# Patient Record
Sex: Male | Born: 1960 | Race: White | Hispanic: No | Marital: Married | Smoking: Current every day smoker
Health system: Southern US, Community
[De-identification: ages and names within clinical notes are randomized; demographics above are authoritative.]

## PROBLEM LIST (undated history)

## (undated) DIAGNOSIS — I2510 Atherosclerotic heart disease of native coronary artery without angina pectoris: Secondary | ICD-10-CM

---

## 2016-05-24 ENCOUNTER — Emergency Department
Admission: EM | Admit: 2016-05-24 | Discharge: 2016-05-24 | Disposition: A | Discharge status: Home / Self Care | Payer: No Typology Code available for payment source | Attending: Emergency Medicine | Admitting: Emergency Medicine

## 2016-05-24 ENCOUNTER — Encounter: Payer: Self-pay | Admitting: Emergency Medicine

## 2016-05-24 DIAGNOSIS — R631 Polydipsia: Secondary | ICD-10-CM

## 2016-05-24 DIAGNOSIS — E871 Hypo-osmolality and hyponatremia: Secondary | ICD-10-CM | POA: Diagnosis not present

## 2016-05-24 DIAGNOSIS — E1165 Type 2 diabetes mellitus with hyperglycemia: Secondary | ICD-10-CM | POA: Diagnosis not present

## 2016-05-24 DIAGNOSIS — R358 Other polyuria: Secondary | ICD-10-CM

## 2016-05-24 DIAGNOSIS — I2510 Atherosclerotic heart disease of native coronary artery without angina pectoris: Secondary | ICD-10-CM | POA: Diagnosis not present

## 2016-05-24 DIAGNOSIS — E119 Type 2 diabetes mellitus without complications: Secondary | ICD-10-CM

## 2016-05-24 DIAGNOSIS — R3589 Other polyuria: Secondary | ICD-10-CM

## 2016-05-24 DIAGNOSIS — Z79899 Other long term (current) drug therapy: Secondary | ICD-10-CM | POA: Diagnosis not present

## 2016-05-24 HISTORY — DX: Atherosclerotic heart disease of native coronary artery without angina pectoris: I25.10

## 2016-05-24 LAB — BASIC METABOLIC PANEL
ANION GAP: 5 (ref 5–15)
Anion gap: 7 (ref 5–15)
BUN: 15 mg/dL (ref 6–20)
BUN: 14 mg/dL (ref 6–20)
CALCIUM: 8.7 mg/dL — AB (ref 8.9–10.3)
CALCIUM: 8.3 mg/dL — AB (ref 8.9–10.3)
CO2: 27 mmol/L (ref 22–32)
CO2: 28 mmol/L (ref 22–32)
CREATININE: 0.94 mg/dL (ref 0.61–1.24)
Chloride: 95 mmol/L — ABNORMAL LOW (ref 101–111)
Chloride: 99 mmol/L — ABNORMAL LOW (ref 101–111)
Creatinine, Ser: 0.85 mg/dL (ref 0.61–1.24)
GFR calc Af Amer: >60 mL/min (ref >60)
GFR calc non Af Amer: >60 mL/min (ref >60)
GFR calc non Af Amer: >60 mL/min (ref >60)
GLUCOSE: 507 mg/dL — AB (ref 65–99)
Glucose, Bld: 325 mg/dL — ABNORMAL HIGH (ref 65–99)
Potassium: 4.4 mmol/L (ref 3.5–5.1)
Potassium: 3.9 mmol/L (ref 3.5–5.1)
Sodium: 129 mmol/L — ABNORMAL LOW (ref 135–145)
Sodium: 132 mmol/L — ABNORMAL LOW (ref 135–145)

## 2016-05-24 LAB — GLUCOSE, CAPILLARY: Glucose-Capillary: 508 mg/dL (ref 65–99)

## 2016-05-24 LAB — URINALYSIS, COMPLETE (UACMP) WITH MICROSCOPIC
Bacteria, UA: NONE SEEN
Bilirubin Urine: NEGATIVE
Hgb urine dipstick: NEGATIVE
Ketones, ur: NEGATIVE mg/dL
Leukocytes, UA: NEGATIVE
Nitrite: NEGATIVE
PH: 6 (ref 5.0–8.0)
PROTEIN: NEGATIVE mg/dL
RBC / HPF: NONE SEEN RBC/hpf (ref 0–5)
Specific Gravity, Urine: 1.033 — ABNORMAL HIGH (ref 1.005–1.030)
Squamous Epithelial / LPF: NONE SEEN
WBC UA: NONE SEEN WBC/hpf (ref 0–5)

## 2016-05-24 LAB — CBC
HCT: 45.6 % (ref 40.0–52.0)
Hemoglobin: 15.8 g/dL (ref 13.0–18.0)
MCH: 32.2 pg (ref 26.0–34.0)
MCHC: 34.8 g/dL (ref 32.0–36.0)
MCV: 92.5 fL (ref 80.0–100.0)
PLATELETS: 157 10*3/uL (ref 150–440)
RBC: 4.92 MIL/uL (ref 4.40–5.90)
RDW: 13.3 % (ref 11.5–14.5)
WBC: 6.3 10*3/uL (ref 3.8–10.6)

## 2016-05-24 MED ORDER — SODIUM CHLORIDE 0.9 % IV BOLUS (SEPSIS)
1000.0000 mL | Freq: Once | INTRAVENOUS | Status: AC
  Administered 2016-05-24: 1000 mL via INTRAVENOUS

## 2016-05-24 MED ORDER — INSULIN ASPART 100 UNIT/ML ~~LOC~~ SOLN
10.0000 [IU] | Freq: Once | SUBCUTANEOUS | Status: AC
  Administered 2016-05-24: 10 [IU] via INTRAVENOUS
  Filled 2016-05-24: qty 10

## 2016-05-24 MED ORDER — METFORMIN HCL 500 MG PO TABS: 500.0000 mg | ORAL_TABLET | Freq: Two times a day (BID) | ORAL | 0 refills | Status: AC

## 2016-05-24 NOTE — ED Provider Notes (Signed)
[facility] Emergency Department Provider Note  ____________________________________________  Time seen: Approximately 4:45 PM  I have reviewed the triage vital signs and the nursing notes.   HISTORY  Chief Complaint Hyperglycemia    HPI Jim Black is a 56 y.o. male with a history of CAD, obesity, presenting for hyperglycemia. The patient [facility] when routine labs revealed a blood sugar of greater than 500. The cardiologist called and recommended ED evaluation. The patient reports that since December 15, he has had polyuria and polydipsia. He denies any recent illness including fever, chills, urinary symptoms, abdominal pain, nausea vomiting or diarrhea.   Past Medical History:  Diagnosis Date  . Coronary artery disease     There are no active problems to display for this patient.   History reviewed. No pertinent surgical history.    Allergies Patient has no known allergies.  No family history on file.  Social History Social History  Substance Use Topics  . Smoking status: Current Every Day Smoker  . Smokeless tobacco: Never Used  . Alcohol use No    Review of Systems Constitutional: No fever/chills.No lightheadedness or syncope. Eyes: No visual changes. ENT: No sore throat. No congestion or rhinorrhea. Cardiovascular: Denies chest pain. Denies palpitations. Respiratory: Denies shortness of breath.  No cough. Gastrointestinal: No abdominal pain.  No nausea, no vomiting.  No diarrhea.  No constipation. Positive polyuria. Positive polydipsia. Genitourinary: Negative for dysuria. Musculoskeletal: Negative for back pain. Skin: Negative for rash. Neurological: Negative for headaches. No focal numbness, tingling or weakness.   10-point ROS otherwise negative.  ____________________________________________   PHYSICAL EXAM:  VITAL SIGNS: ED Triage Vitals [05/24/16 1517]  Enc  Vitals Group     BP 134/82     Pulse Rate 79     Resp 16     Temp 98 F (36.7 C)     Temp Source Oral     SpO2 96 %     Weight 287 lb (130.2 kg)     Height 6\' 1"  (1.854 m)     Head Circumference      Peak Flow      Pain Score      Pain Loc      Pain Edu?      Excl. in GC?     Constitutional: Alert and oriented. Well appearing and in no acute distress. Answers questions appropriately. Eyes: Conjunctivae are normal.  EOMI. No scleral icterus. Head: Atraumatic. Nose: No congestion/rhinnorhea. Mouth/Throat: Mucous membranes are moist.  Neck: No stridor.  Supple.   Cardiovascular: Normal rate, regular rhythm. No murmurs, rubs or gallops.  Respiratory: Normal respiratory effort.  No accessory muscle use or retractions. Lungs CTAB.  No wheezes, rales or ronchi. Gastrointestinal: Obese. Soft, nontender and nondistended.  No guarding or rebound.  No peritoneal signs. Musculoskeletal: No LE edema.  Neurologic:  A&Ox3.  Speech is clear.  Face and smile are symmetric.  EOMI.  Moves all extremities well. Skin:  Skin is warm, dry and intact. No rash noted. Psychiatric: Mood and affect are normal. Speech and behavior are normal.  Normal judgement.  ____________________________________________   LABS (all labs ordered are listed, but only abnormal results are displayed)  Labs Reviewed  BASIC METABOLIC PANEL - Abnormal; Notable for the following:       Result Value   Sodium 129 (*)    Chloride 95 (*)    Glucose, Bld 507 (*)  Calcium 8.7 (*)    All other components within normal limits  URINALYSIS, COMPLETE (UACMP) WITH MICROSCOPIC - Abnormal; Notable for the following:    Color, Urine STRAW (*)    APPearance CLEAR (*)    Specific Gravity, Urine 1.033 (*)    Glucose, UA >=500 (*)    All other components within normal limits  GLUCOSE, CAPILLARY - Abnormal; Notable for the following:    Glucose-Capillary 508 (*)    All other components within normal limits  CBC  BASIC METABOLIC  PANEL  CBG MONITORING, ED   ____________________________________________  EKG  ED ECG REPORT I, Rockne Menghini, the attending physician, personally viewed and interpreted this ECG.   Date: 05/24/2016  EKG Time: 1833  Rate: 71  Rhythm: normal sinus rhythm  Axis: leftward  Intervals:none  ST&T Change: Nonspecific T-wave inversions in V1- V4; no STEMI.  ____________________________________________  RADIOLOGY  No results found.  ____________________________________________   PROCEDURES  Procedure(s) performed: None  Procedures  Critical Care performed: No ____________________________________________   INITIAL IMPRESSION / ASSESSMENT AND PLAN / ED COURSE  Pertinent labs & imaging results that were available during my care of the patient were reviewed by me and considered in my medical decision making (see chart for details).  56 y.o. male presenting with hyperglycemia. This patient has new onset diabetes, but no evidence of DKA today. He has mild hyponatremia which is likely due to his hyperglycemia, and we will recheck with a blood sugar and a sodium after fluid and insulin. I've spoken with Dr. Judithann Sheen, who is covering for Dr. Marcello Fennel today, who recommends starting the patient on metformin 500 mg twice a day, and having him follow-up in the primary care physician's office in the next 1-2 days. I've discussed this with the patient and he understands return precautions as well as follow-up instructions.  ____________________________________________  FINAL CLINICAL IMPRESSION(S) / ED DIAGNOSES  Final diagnoses:  New onset type 2 diabetes mellitus (HCC)  Hyponatremia  Polyuria  Polydipsia    Clinical Course       NEW MEDICATIONS STARTED DURING THIS VISIT:  New Prescriptions   METFORMIN (GLUCOPHAGE) 500 MG TABLET    Take 1 tablet (500 mg total) by mouth 2 (two) times daily with a meal.      Rockne Menghini, MD 05/24/16 1849

## 2016-05-24 NOTE — ED Triage Notes (Signed)
Pt was instructed by PCP to come here for elevated CBG. Pt states was 575.

## 2016-05-24 NOTE — Discharge Instructions (Signed)
Please start taking metformin 500 mg twice daily tomorrow morning. Please call to schedule an appointment with a primary care physician in the next 1-2 days.  Return to the emergency department if you develop severe pain, vomiting, fever, lightheadedness or fainting, or any other symptoms concerning to you.

## 2016-05-24 NOTE — ED Notes (Signed)
Pt states blood draw at PCP this morning was not fasting. Pt reports having breakfast prior to blood draw. Pt reports having lunch prior to coming to ED.

## 2017-01-04 ENCOUNTER — Encounter: Payer: Self-pay | Admitting: Emergency Medicine

## 2017-01-04 ENCOUNTER — Emergency Department: Payer: No Typology Code available for payment source

## 2017-01-04 ENCOUNTER — Emergency Department
Admission: EM | Admit: 2017-01-04 | Discharge: 2017-01-04 | Disposition: A | Discharge status: Home / Self Care | Payer: No Typology Code available for payment source | Attending: Emergency Medicine | Admitting: Emergency Medicine

## 2017-01-04 DIAGNOSIS — I2510 Atherosclerotic heart disease of native coronary artery without angina pectoris: Secondary | ICD-10-CM | POA: Insufficient documentation

## 2017-01-04 DIAGNOSIS — I509 Heart failure, unspecified: Secondary | ICD-10-CM | POA: Diagnosis not present

## 2017-01-04 DIAGNOSIS — Z79899 Other long term (current) drug therapy: Secondary | ICD-10-CM | POA: Diagnosis not present

## 2017-01-04 DIAGNOSIS — R0602 Shortness of breath: Secondary | ICD-10-CM | POA: Diagnosis present

## 2017-01-04 DIAGNOSIS — F17210 Nicotine dependence, cigarettes, uncomplicated: Secondary | ICD-10-CM | POA: Insufficient documentation

## 2017-01-04 LAB — CBC
HEMATOCRIT: 44.1 % (ref 40.0–52.0)
Hemoglobin: 15.4 g/dL (ref 13.0–18.0)
MCH: 31.2 pg (ref 26.0–34.0)
MCHC: 34.9 g/dL (ref 32.0–36.0)
MCV: 89.6 fL (ref 80.0–100.0)
PLATELETS: 154 10*3/uL (ref 150–440)
RBC: 4.92 MIL/uL (ref 4.40–5.90)
RDW: 13.6 % (ref 11.5–14.5)
WBC: 7 10*3/uL (ref 3.8–10.6)

## 2017-01-04 LAB — BASIC METABOLIC PANEL
Anion gap: 8 (ref 5–15)
BUN: 10 mg/dL (ref 6–20)
CHLORIDE: 101 mmol/L (ref 101–111)
CO2: 26 mmol/L (ref 22–32)
CREATININE: 0.83 mg/dL (ref 0.61–1.24)
Calcium: 8.6 mg/dL — ABNORMAL LOW (ref 8.9–10.3)
GFR calc non Af Amer: >60 mL/min (ref >60)
Glucose, Bld: 280 mg/dL — ABNORMAL HIGH (ref 65–99)
POTASSIUM: 4 mmol/L (ref 3.5–5.1)
Sodium: 135 mmol/L (ref 135–145)

## 2017-01-04 LAB — BRAIN NATRIURETIC PEPTIDE: B Natriuretic Peptide: 35 pg/mL (ref 0.0–100.0)

## 2017-01-04 LAB — TROPONIN I: Troponin I: <0.03 ng/mL (ref <0.03)

## 2017-01-04 NOTE — ED Provider Notes (Signed)
[facility] Emergency Department Provider Note  ____________________________________________   I have reviewed the triage vital signs and the nursing notes.   HISTORY  Chief Complaint I'm ready to go   HPI Jim Black is a 56 y.o. male with a history of chf. He says he is on torsemide daily. He forgot to take it yesterday. Today, he felt that he was having some fluid build up, as he was mildly sob.  He took his torsemide and went to the bathroom 11 times since he got here and now he is back to normal and wants to go home. No chest pain no leg swelling no hx of dvt or pe no n/v no leg swelling  Declines further w/u.   Past Medical History:  Diagnosis Date  . Coronary artery disease     There are no active problems to display for this patient.   History reviewed. No pertinent surgical history.  Prior to Admission medications   Medication Sig Start Date End Date Taking? Authorizing Provider  carvedilol (COREG) 25 MG tablet Take 25 mg by mouth 2 (two) times daily with a meal.    [provider]  citalopram (CELEXA) 40 MG tablet Take 40 mg by mouth daily.    [provider]  metFORMIN (GLUCOPHAGE) 500 MG tablet Take 1 tablet (500 mg total) by mouth 2 (two) times daily with a meal. 05/24/16 05/24/17  Rockne Menghini, MD    Allergies Patient has no known allergies.  History reviewed. No pertinent family history.  Social History Social History  Substance Use Topics  . Smoking status: Current Every Day Smoker  . Smokeless tobacco: Never Used  . Alcohol use No    Review of Systems Constitutional: No fever/chills Eyes: No visual changes. ENT: No sore throat. No stiff neck no neck pain Cardiovascular: Denies chest pain. Respiratory: Denies onging shortness of breath. Gastrointestinal:   no vomiting.  No diarrhea.  No constipation. Genitourinary: Negative for dysuria. Musculoskeletal: Negative lower extremity swelling Skin:  Negative for rash. Neurological: Negative for severe headaches, focal weakness or numbness.   ____________________________________________   PHYSICAL EXAM:  VITAL SIGNS: ED Triage Vitals  Enc Vitals Group     BP 01/04/17 1316 (!) 141/85     Pulse Rate 01/04/17 1316 100     Resp 01/04/17 1316 18     Temp 01/04/17 1316 97.8 F (36.6 C)     Temp Source 01/04/17 1316 Oral     SpO2 01/04/17 1316 100 %     Weight 01/04/17 1317 (!) 301 lb (136.5 kg)     Height 01/04/17 1317 6\' 1"  (1.854 m)     Head Circumference --      Peak Flow --      Pain Score 01/04/17 1315 0     Pain Loc --      Pain Edu? --      Excl. in GC? --     Constitutional: Alert and oriented. Well appearing and in no acute distress. Eyes: Conjunctivae are normal Head: Atraumatic HEENT: No congestion/rhinnorhea. Mucous membranes are moist.  Oropharynx non-erythematous Neck:   Nontender with no meningismus, no masses, no stridor Cardiovascular: Normal rate, regular rhythm. Grossly normal heart sounds.  Good peripheral circulation. Respiratory: Normal respiratory effort.  No retractions. Lungs CTAB. Abdominal: Soft and nontender. No distention. No guarding no rebound Back:  There is no focal tenderness or step off.  there is no midline tenderness there are no lesions noted. there is no  CVA tenderness Musculoskeletal: No lower extremity tenderness, no upper extremity tenderness. No joint effusions, no DVT signs strong distal pulses no edema Neurologic:  Normal speech and language. No gross focal neurologic deficits are appreciated.  Skin:  Skin is warm, dry and intact. No rash noted. Psychiatric: Mood and affect are normal. Speech and behavior are normal.  ____________________________________________   LABS (all labs ordered are listed, but only abnormal results are displayed)  Labs Reviewed  BASIC METABOLIC PANEL - Abnormal; Notable for the following:       Result Value   Glucose, Bld 280 (*)    Calcium 8.6  (*)    All other components within normal limits  CBC  TROPONIN I  BRAIN NATRIURETIC PEPTIDE   ____________________________________________  EKG  I personally interpreted any EKGs ordered by me or triage Sinus tach rate 101 lad noted no ste no std  ____________________________________________  RADIOLOGY  I reviewed any imaging ordered by me or triage that were performed during my shift and, if possible, patient and/or family made aware of any abnormal findings. ____________________________________________   PROCEDURES  Procedure(s) performed: None  Procedures  Critical Care performed: None  ____________________________________________   INITIAL IMPRESSION / ASSESSMENT AND PLAN / ED COURSE  Pertinent labs & imaging results that were available during my care of the patient were reviewed by me and considered in my medical decision making (see chart for details).  Pt in nad with no complaints or sx. Was noncompliant with his torsemide, had some subjective fluid build up and took his torsemide and his symptoms resolved with diuresis. Patient has absolutely no symptoms at this time. I did offer him further observational stay, I offered him torsemide here I offered him serial cardiac enzymes. He refuses all of this. He states he just wants to go home. This time I dee any evidence of impending disaster and there certainly was never any chest pain. However, patient is aware that my official recommendation is that he stay longer for further evaluation of the symptoms. Nothing to suggest ACS PE or dissection given w/u that patient permits. Return precautions and f/u stressed.     ____________________________________________   FINAL CLINICAL IMPRESSION(S) / ED DIAGNOSES  Final diagnoses:  None      This chart was dictated using voice recognition software.  Despite best efforts to proofread,  errors can occur which can change meaning.      Jeanmarie Plant, MD 01/04/17  [phone]

## 2017-01-04 NOTE — ED Triage Notes (Signed)
Pt to ed with c/o sob acute onset about 1 hour ago.  Pt reports he took 1 torsemide at home which he uses daily for CHF.  Pt reports this feels like CHF.  No noted swelling to pt feet and ankles, although pt does report distention in abd.

## 2017-01-04 NOTE — Discharge Instructions (Signed)
Return to the emergency department for any new or worrisome symptoms including chest pain or shortness of breath.  As we discussed, without further stay in the emergency department there is a limit to what we can do, and you have declined to stay further. This is certainly your  choice but does limit our ability to evaluate you. It is therefore essential that you  remain vigilant about your  health and return to the emergency department for any new or worrisome symptoms. Take your torsemide every day, and follow closely with primary care and cardiology as an outpatient.

## 2018-07-12 IMAGING — CR DG CHEST 2V
2 series · 2 of 2 positions shown · non-contrast
Comparison: None.

CLINICAL DATA: Chest pain and shortness of breath.

EXAM:
CHEST  2 VIEW

[chest pa]
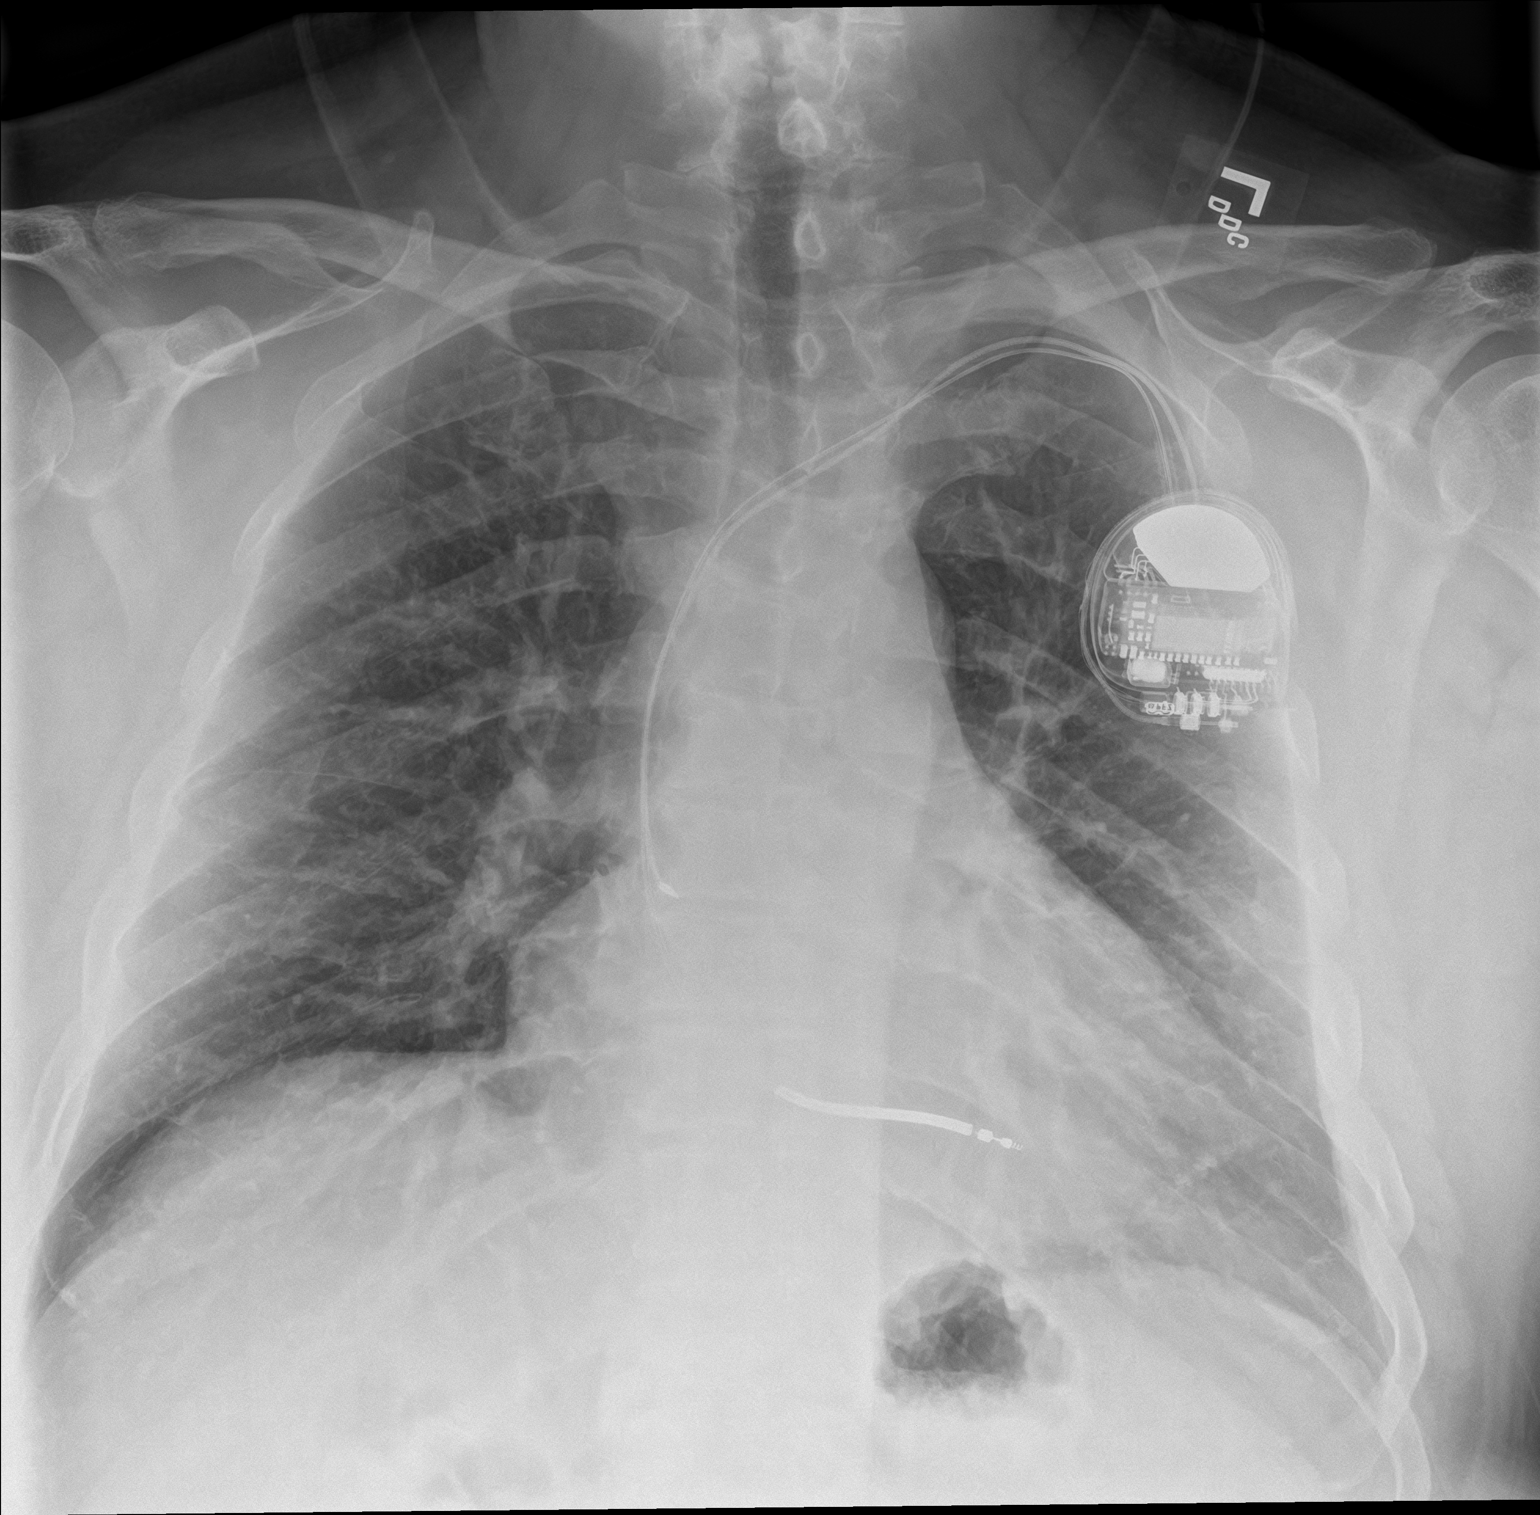

[chest lat]
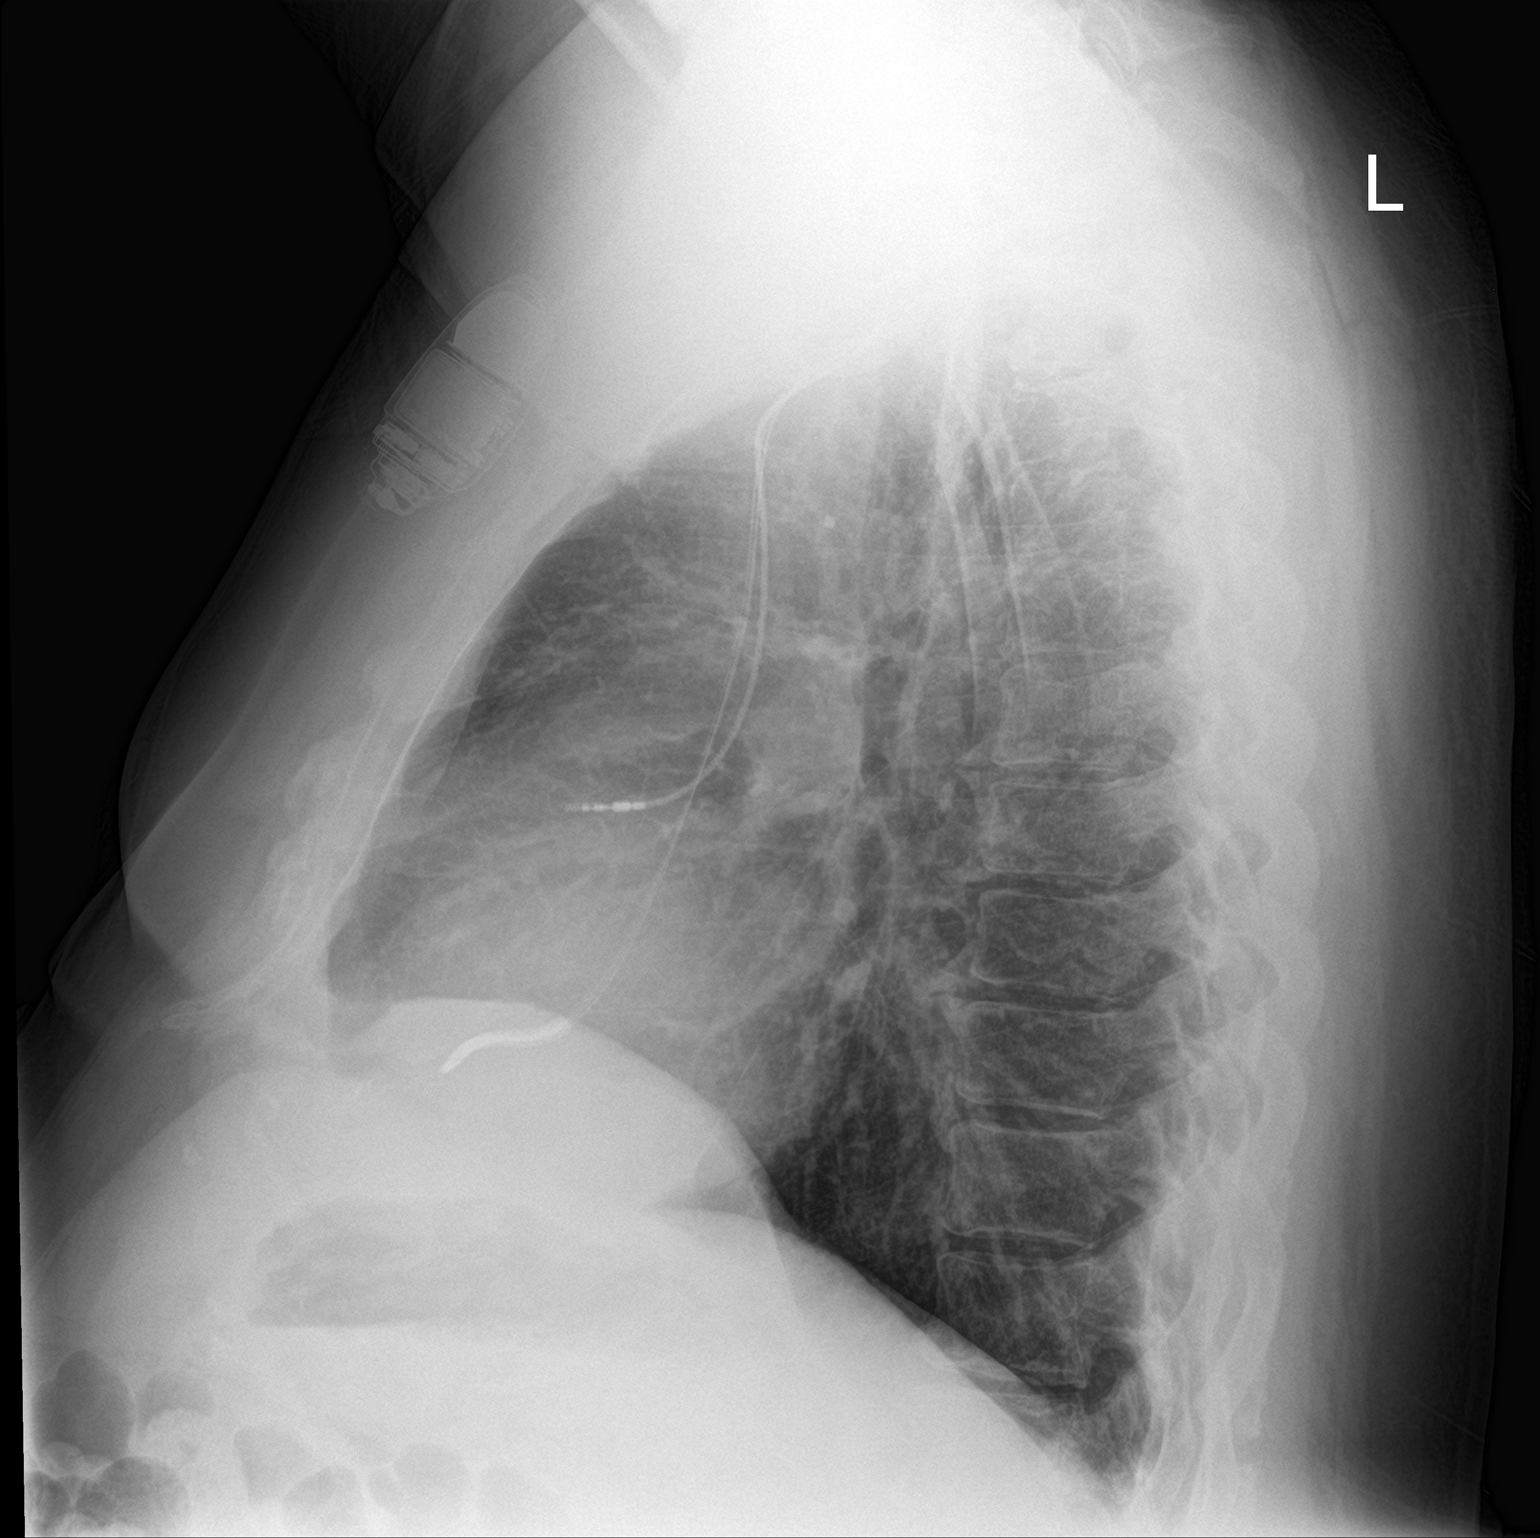

[2 of 2 positions shown; findings below may reference images not displayed]

FINDINGS: The cardiomediastinal silhouette is enlarged. Left chest wall AICD
with leads terminating in the right ventricle and right atrium. Mild
pulmonary vascular congestion. No focal consolidation, pleural
effusion, or pneumothorax. No acute osseous abnormality.
IMPRESSION: Cardiomegaly with mild pulmonary vascular congestion.
